# Patient Record
Sex: Male | Born: 1980 | Race: Black or African American | Hispanic: No | Marital: Single | Smoking: Never smoker
Health system: Southern US, Community
[De-identification: ages and names within clinical notes are randomized; demographics above are authoritative.]

## PROBLEM LIST (undated history)

## (undated) DIAGNOSIS — F819 Developmental disorder of scholastic skills, unspecified: Secondary | ICD-10-CM

## (undated) DIAGNOSIS — G510 Bell's palsy: Principal | ICD-10-CM

## (undated) HISTORY — PX: OTHER SURGICAL HISTORY: SHX169

## (undated) HISTORY — DX: Developmental disorder of scholastic skills, unspecified: F81.9

## (undated) HISTORY — DX: Bell's palsy: G51.0

---

## 2003-06-28 ENCOUNTER — Emergency Department (HOSPITAL_COMMUNITY): Admission: EM | Admit: 2003-06-28 | Discharge: 2003-06-28 | Payer: Self-pay | Admitting: Emergency Medicine

## 2004-05-08 ENCOUNTER — Emergency Department (HOSPITAL_COMMUNITY): Admission: EM | Admit: 2004-05-08 | Discharge: 2004-05-08 | Payer: Self-pay | Admitting: Family Medicine

## 2004-05-11 ENCOUNTER — Emergency Department (HOSPITAL_COMMUNITY): Admission: EM | Admit: 2004-05-11 | Discharge: 2004-05-11 | Payer: Self-pay | Admitting: Family Medicine

## 2004-05-12 ENCOUNTER — Emergency Department (HOSPITAL_COMMUNITY): Admission: EM | Admit: 2004-05-12 | Discharge: 2004-05-12 | Payer: Self-pay | Admitting: Emergency Medicine

## 2006-02-21 ENCOUNTER — Emergency Department (HOSPITAL_COMMUNITY): Admission: EM | Admit: 2006-02-21 | Discharge: 2006-02-21 | Payer: Self-pay | Admitting: Emergency Medicine

## 2009-08-01 ENCOUNTER — Emergency Department (HOSPITAL_COMMUNITY): Admission: EM | Admit: 2009-08-01 | Discharge: 2009-08-01 | Payer: Self-pay | Admitting: Emergency Medicine

## 2010-04-02 ENCOUNTER — Inpatient Hospital Stay (INDEPENDENT_AMBULATORY_CARE_PROVIDER_SITE_OTHER)
Admission: RE | Admit: 2010-04-02 | Discharge: 2010-04-02 | Disposition: A | Discharge status: Home / Self Care | Payer: Self-pay | Source: Ambulatory Visit | Attending: Emergency Medicine | Admitting: Emergency Medicine

## 2010-04-02 ENCOUNTER — Ambulatory Visit (INDEPENDENT_AMBULATORY_CARE_PROVIDER_SITE_OTHER): Payer: Medicaid Other

## 2010-04-02 DIAGNOSIS — S93609A Unspecified sprain of unspecified foot, initial encounter: Secondary | ICD-10-CM

## 2011-05-03 ENCOUNTER — Other Ambulatory Visit: Payer: Self-pay | Admitting: Internal Medicine

## 2011-12-20 ENCOUNTER — Emergency Department (INDEPENDENT_AMBULATORY_CARE_PROVIDER_SITE_OTHER): Payer: Medicare Other

## 2011-12-20 ENCOUNTER — Encounter (HOSPITAL_COMMUNITY): Payer: Self-pay

## 2011-12-20 ENCOUNTER — Emergency Department (INDEPENDENT_AMBULATORY_CARE_PROVIDER_SITE_OTHER)
Admission: EM | Admit: 2011-12-20 | Discharge: 2011-12-20 | Disposition: A | Discharge status: Home / Self Care | Payer: Medicare Other | Source: Home / Self Care | Attending: Family Medicine | Admitting: Family Medicine

## 2011-12-20 DIAGNOSIS — J069 Acute upper respiratory infection, unspecified: Secondary | ICD-10-CM

## 2011-12-20 MED ORDER — ACETAMINOPHEN 325 MG PO TABS
ORAL_TABLET | ORAL | Status: AC
  Filled 2011-12-20: qty 3

## 2011-12-20 MED ORDER — LORATADINE-PSEUDOEPHEDRINE ER 10-240 MG PO TB24: 1.0000 | ORAL_TABLET | Freq: Every day | ORAL | Status: DC

## 2011-12-20 MED ORDER — AZITHROMYCIN 250 MG PO TABS: ORAL_TABLET | ORAL | Status: DC

## 2011-12-20 MED ORDER — ACETAMINOPHEN 500 MG PO TABS
1000.0000 mg | ORAL_TABLET | Freq: Once | ORAL | Status: AC
  Administered 2011-12-20: 1000 mg via ORAL

## 2011-12-20 NOTE — ED Provider Notes (Signed)
Medical screening examination/treatment/procedure(s) were performed by non-physician practitioner and as supervising physician I was immediately available for consultation/collaboration.   MORENO-COLL,Demere Dotzler; MD   Eular Panek Moreno-Coll, MD 12/20/11 1442 

## 2011-12-20 NOTE — ED Notes (Signed)
C/o general body aches, fever since yesterday; here with mother , who is also being seen for similar syx

## 2011-12-20 NOTE — ED Provider Notes (Signed)
History     CSN: [id number]  Arrival date & time 12/20/11  1010   First MD Initiated Contact with Patient 12/20/11 1031      Chief Complaint  Patient presents with  . Fever    (Consider location/radiation/quality/duration/timing/severity/associated sxs/prior treatment) Patient is a 31 y.o. male presenting with fever. The history is provided by the patient.  Fever Primary symptoms of the febrile illness include fever, cough, nausea and myalgias. The current episode started yesterday. This is a new problem.  The maximum temperature recorded prior to his arrival was 101 to 101.9 F.    History reviewed. No pertinent past medical history.  History reviewed. No pertinent past surgical history.  History reviewed. No pertinent family history.  History  Substance Use Topics  . Smoking status: Not on file  . Smokeless tobacco: Not on file  . Alcohol Use: Not on file      Review of Systems  Constitutional: Positive for fever, chills and appetite change.  HENT: Positive for congestion, sore throat, rhinorrhea and sneezing.   Respiratory: Positive for cough.   Cardiovascular: Positive for chest pain.  Gastrointestinal: Positive for nausea.  Musculoskeletal: Positive for myalgias.  Neurological: Positive for dizziness.  All other systems reviewed and are negative.    Allergies  Review of patient's allergies indicates no known allergies.  Home Medications   Current Outpatient Rx  Name  Route  Sig  Dispense  Refill  . AZITHROMYCIN 250 MG PO TABS      Azithromycin 500mg  on day 1, then 250mg  on days 2-4   6 tablet   0   . LORATADINE-PSEUDOEPHEDRINE ER 10-240 MG PO TB24   Oral   Take 1 tablet by mouth daily.   30 tablet   0     BP 115/74  Pulse 93  Temp 98.3 F (36.8 C) (Oral)  Resp 19  SpO2 93%  Physical Exam  Nursing note and vitals reviewed. Constitutional: He is oriented to person, place, and time. Vital signs are normal. He appears well-developed and  well-nourished. He is active and cooperative.  HENT:  Head: Normocephalic.  Right Ear: Hearing, external ear and ear canal normal. Tympanic membrane is bulging.  Left Ear: Hearing, external ear and ear canal normal. Tympanic membrane is bulging.  Nose: Nose normal. Right sinus exhibits no maxillary sinus tenderness and no frontal sinus tenderness. Left sinus exhibits no maxillary sinus tenderness and no frontal sinus tenderness.  Mouth/Throat: Uvula is midline and mucous membranes are normal. Posterior oropharyngeal erythema present. No oropharyngeal exudate or posterior oropharyngeal edema.  Eyes: Conjunctivae normal are normal. Pupils are equal, round, and reactive to light. No scleral icterus.  Neck: Trachea normal. Neck supple.  Cardiovascular: Normal rate and regular rhythm.   Pulmonary/Chest: Effort normal and breath sounds normal.  Neurological: He is alert and oriented to person, place, and time. No cranial nerve deficit or sensory deficit.  Skin: Skin is warm and dry.  Psychiatric: He has a normal mood and affect. His speech is normal and behavior is normal. Judgment and thought content normal. Cognition and memory are normal.    ED Course  Procedures (including critical care time)   Labs Reviewed  POCT RAPID STREP A ([facility] CARE ONLY)   Dg Chest 2 View  12/20/2011  *RADIOLOGY REPORT*  Clinical Data: Fever, cough.  CHEST - 2 VIEW  Comparison: None.  Findings: Mild elevation of the left hemidiaphragm.  No confluent airspace opacities.  Heart is normal size.  No effusions  or acute bony abnormality.  IMPRESSION: No acute cardiopulmonary disease.   Original Report Authenticated By: Charlett Nose, M.D.      1. URI (upper respiratory infection)       MDM  Increase fluid intake, rest.  Begin Azithromycin.  Begin expectorant/decongestant, topical decongestant, saline nasal spray and cough suppressant at bedtime. Antihistamines of your choice (Claritin or Zyrtec).  Tylenol or Motrin  for fever/discomfort.  Followup with PCP if not improving 7 to 10 days.         Johnsie Kindred, NP 12/20/11 1308

## 2012-12-09 DIAGNOSIS — G510 Bell's palsy: Secondary | ICD-10-CM

## 2012-12-09 HISTORY — DX: Bell's palsy: G51.0

## 2012-12-24 ENCOUNTER — Encounter (HOSPITAL_COMMUNITY): Payer: Self-pay | Admitting: Emergency Medicine

## 2012-12-24 DIAGNOSIS — G510 Bell's palsy: Secondary | ICD-10-CM | POA: Insufficient documentation

## 2012-12-24 DIAGNOSIS — H5789 Other specified disorders of eye and adnexa: Secondary | ICD-10-CM | POA: Insufficient documentation

## 2012-12-24 NOTE — ED Notes (Addendum)
Pt states around 1900 last night he started having some facial swelling in his right side and some twitching in his right eye.  Pt denies any other symptoms including weakness, SOB, CP or nausea.  Mother states family history of bells pausy

## 2012-12-25 ENCOUNTER — Emergency Department (HOSPITAL_COMMUNITY)
Admission: EM | Admit: 2012-12-25 | Discharge: 2012-12-25 | Disposition: A | Discharge status: Home / Self Care | Payer: Medicare Other | Attending: Emergency Medicine | Admitting: Emergency Medicine

## 2012-12-25 DIAGNOSIS — G510 Bell's palsy: Secondary | ICD-10-CM

## 2012-12-25 MED ORDER — PREDNISONE 20 MG PO TABS: ORAL_TABLET | ORAL | Status: DC

## 2012-12-25 MED ORDER — PREDNISONE 20 MG PO TABS
60.0000 mg | ORAL_TABLET | Freq: Once | ORAL | Status: AC
  Administered 2012-12-25: 60 mg via ORAL
  Filled 2012-12-25: qty 3

## 2012-12-25 MED ORDER — ARTIFICIAL TEARS OP OINT: TOPICAL_OINTMENT | Freq: Three times a day (TID) | OPHTHALMIC | Status: DC

## 2012-12-25 MED ORDER — ARTIFICIAL TEARS OP OINT
TOPICAL_OINTMENT | Freq: Once | OPHTHALMIC | Status: AC
  Administered 2012-12-25: 1 via OPHTHALMIC
  Filled 2012-12-25: qty 3.5

## 2012-12-25 NOTE — ED Provider Notes (Signed)
CSN: [id number]     Arrival date & time 12/24/12  2054 History   First MD Initiated Contact with Patient 12/25/12 0016     Chief Complaint  Patient presents with  . Facial Swelling   (Consider location/radiation/quality/duration/timing/severity/associated sxs/prior Treatment) HPI Patient presents with greater than 24 hours of right facial droop and right eye irritation. He denies taste or hearing changes. He's had no recent fevers or chills. He has no focal extremity weakness or numbness. He has no vision changes. History reviewed. No pertinent past medical history. History reviewed. No pertinent past surgical history. Family History  Problem Relation Age of Onset  . Hyperlipidemia Mother   . Hypertension Mother   . Stroke Mother   . Hypertension Other   . Diabetes Other   . Stroke Other    History  Substance Use Topics  . Smoking status: Never Smoker   . Smokeless tobacco: Not on file  . Alcohol Use: No    Review of Systems  Constitutional: Negative for fever and chills.  HENT: Negative for ear discharge, ear pain, facial swelling, hearing loss, trouble swallowing and voice change.   Eyes: Positive for redness. Negative for visual disturbance.  Respiratory: Negative for chest tightness and shortness of breath.   Cardiovascular: Negative for chest pain.  Gastrointestinal: Negative for nausea, vomiting and abdominal pain.  Musculoskeletal: Negative for back pain, neck pain and neck stiffness.  Skin: Negative for rash and wound.  Neurological: Positive for facial asymmetry and weakness (right upper and lower facial weakness). Negative for dizziness, light-headedness, numbness and headaches.  All other systems reviewed and are negative.    Allergies  Review of patient's allergies indicates no known allergies.  Home Medications   Current Outpatient Rx  Name  Route  Sig  Dispense  Refill  . Ketotifen Fumarate (ALLERGY EYE DROPS OP)   Ophthalmic   Apply 1 drop to eye  daily.          BP 122/70  Pulse 59  Temp(Src) 97.6 F (36.4 C) (Oral)  Resp 18  Wt 216 lb 2 oz (98.034 kg)  SpO2 99% Physical Exam  Nursing note and vitals reviewed. Constitutional: He is oriented to person, place, and time. He appears well-developed and well-nourished. No distress.  HENT:  Head: Normocephalic and atraumatic.  Right Ear: External ear normal.  Left Ear: External ear normal.  Mouth/Throat: Oropharynx is clear and moist.  No facial swelling  Eyes: EOM are normal. Pupils are equal, round, and reactive to light.  Mild conjunctival injection in the right eye.  Neck: Normal range of motion. Neck supple.  No meningismus  Cardiovascular: Normal rate and regular rhythm.   Pulmonary/Chest: Effort normal and breath sounds normal. No respiratory distress. He has no wheezes. He has no rales. He exhibits no tenderness.  Abdominal: Soft. Bowel sounds are normal. He exhibits no distension and no mass. There is no tenderness. There is no rebound and no guarding.  Musculoskeletal: Normal range of motion. He exhibits no edema and no tenderness.  Neurological: He is alert and oriented to person, place, and time.  Patient is alert and oriented x3 with clear, goal oriented speech. Patient has 5/5 motor in all extremities. Sensation is intact to light touch. Bilateral finger-to-nose is normal with no signs of dysmetria. Patient has a normal gait and walks without assistance. Patient has both upper and lower facial weakness on the right. No facial numbness.   Skin: Skin is warm and dry. No rash noted. No erythema.  Psychiatric: He has a normal mood and affect. His behavior is normal.    ED Course  Procedures (including critical care time) Labs Review Labs Reviewed - No data to display Imaging Review No results found.  EKG Interpretation   None       MDM   1. Bell's palsy    His history and exam are consistent with Bell's palsy. We'll provide eye protection and started  on. Patient is to followup with neurology. Return precautions have been given.    Loren Racer, MD 12/25/12 [phone]

## 2012-12-25 NOTE — ED Notes (Signed)
Dr Yelverton at bedside.  

## 2012-12-30 ENCOUNTER — Ambulatory Visit (INDEPENDENT_AMBULATORY_CARE_PROVIDER_SITE_OTHER): Payer: Medicare Other | Admitting: Neurology

## 2012-12-30 ENCOUNTER — Encounter: Payer: Self-pay | Admitting: Neurology

## 2012-12-30 VITALS — BP 134/70 | HR 60 | Temp 97.6°F | Resp 12 | Ht 72.0 in | Wt 217.7 lb

## 2012-12-30 DIAGNOSIS — G510 Bell's palsy: Secondary | ICD-10-CM | POA: Insufficient documentation

## 2012-12-30 NOTE — Patient Instructions (Addendum)
You have Bell's Palsy. The management plan is outlined below:  1. Continue to take prednisone as prescribed 2. Apply eye protection such as eye patch to eye when sleeping  3. Apply ointment to eye prior to sleeping  4. Apply normal saline eye drops as needed throughout the day 5. If you develop signs of eye irritation (redness, pain, vision changes), see an eye care specialist 6. Return to clinic in 3-weeks, or sooner

## 2012-12-30 NOTE — Progress Notes (Signed)
[facility] Clinic Note - Initial Visit   Date: 12/30/2012    Stephen Reese MRN: [id number] DOB: 1980-02-10   Dear Dr Ranae Palms:  Thank you for your kind referral of Stephen Reese for consultation of right facial weakness. Although his history is well known to you, please allow [place] to reiterate it for the purpose of our medical record. The patient was accompanied to the clinic by his mother.   History of Present Illness: Stephen Reese is a 32 y.o. year-old left-handed African American male with known learning disability presenting for evaluation of right facial weakness.  On 12/15, his sister noticed that he developed right facial droop which worsened over the next day.  He was seen in the Emergency Department on 12/17 for right facial weakness and diagnosed with Bell's palsy.  Prednisone was started and he was instructed to follow-up with neurology.    He feels that his symptoms are getting a little bit better.  Denies any alleviating or exacerbating factors.  He is using eye ointment three times a day.  Denies any dry eyes, but says that it stings sometimes.  He reports having minimal drooling.  He feels that when he eats, food gets stuck in the corner of his mouth.  Denies any vision, swallowing, or taste problems.  No fevers, chills, or ear or facial pain.  No recent tick bite or rash.  Out-side paper records, electronic medical record, and images have been reviewed where available and summarized as:  CT head wo contrast 06/28/2003:  No evidence of acute intracranial pathology.    Past Medical History  Diagnosis Date  . Bell's palsy 12/2012    Right side  . Learning disability     Past Surgical History  Procedure Laterality Date  . None       Medications:  Current Outpatient Prescriptions on File Prior to Visit  Medication Sig Dispense Refill  . artificial tears (LACRILUBE) OINT ophthalmic ointment Place into the right eye 3 (three) times  daily.  1 Tube  0  . predniSONE (DELTASONE) 20 MG tablet Take 3 tabs PO x 6 days, then 2 tabs PO x 2 days, then 1 tab PO x 2 days  24 tablet  0   No current facility-administered medications on file prior to visit.    Allergies: No Known Allergies  Family History: Family History  Problem Relation Age of Onset  . Hyperlipidemia Mother   . Hypertension Mother   . Transient ischemic attack Mother   . Hypertension Other   . Diabetes Other   . Stroke Other   . Bell's palsy Sister     Social History: History   Social History  . Marital Status: Single    Spouse Name: N/A    Number of Children: N/A  . Years of Education: N/A   Occupational History  . Not on file.   Social History Main Topics  . Smoking status: Never Smoker   . Smokeless tobacco: Not on file  . Alcohol Use: No  . Drug Use: No  . Sexual Activity: Not on file   Other Topics Concern  . Not on file   Social History Narrative   Lives with mother.   He is single.    He has a learning disability.  Highest level of education is high school.    Review of Systems:  CONSTITUTIONAL: No fevers, chills, night sweats, or weight loss.   EYES: No visual changes or eye pain ENT:  No hearing changes.  No history of nose bleeds.   RESPIRATORY: No cough, wheezing and shortness of breath.   CARDIOVASCULAR: Negative for chest pain, and palpitations.   GI: Negative for abdominal discomfort, blood in stools or black stools.  No recent change in bowel habits.   GU:  No history of incontinence.   MUSCLOSKELETAL: No history of joint pain or swelling.  No myalgias.   SKIN: Negative for lesions, rash, and itching.   HEMATOLOGY/ONCOLOGY: Negative for prolonged bleeding, bruising easily, and swollen nodes.    ENDOCRINE: Negative for cold or heat intolerance, polydipsia or goiter.   PSYCH:  No depression or anxiety symptoms.   NEURO: As Above.   Vital Signs:  BP 134/70  Pulse 60  Temp(Src) 97.6 F (36.4 C)  Resp 12  Ht 6'  (1.829 m)  Wt 217 lb 11.2 oz (98.748 kg)  BMI 29.52 kg/m2    Neurological Exam: MENTAL STATUS including orientation to time, place, person, recent and remote memory, attention span and concentration, language, and fund of knowledge is fairly normal.  Speech is slow and fragmented slightly (baseline).  CRANIAL NERVES: II:  No visual field defects.  Unremarkable fundi.   III-IV-VI: Pupils equal round and reactive to light.  Normal conjugate, extra-ocular eye movements in all directions of gaze.  No nystagmus.  Mild right ptosis with markedly reduced blink on the right.  V: Normal facial sensation throughout. VII:  Marked facial asymmetric with right facial weakness.  Frontalis 3/5 on the right (able to make a few creases), oribicularis oculi 3/5 with Bell's phenomenon, oribularis oris 3/5, buccinator 2/5, platysma 2/5 on the right.  Facial muscles on the left are intact and 5/5. VIII:  Normal hearing and vestibular function.   IX-X:  Normal palatal movement.   XI:  Normal shoulder shrug and head rotation.   XII:  Normal tongue strength and range of motion, no deviation or fasciculation.  MOTOR:  No atrophy, fasciculations or abnormal movements.  No pronator drift.  Tone is normal.    Right Upper Extremity:    Left Upper Extremity:    Deltoid  5/5   Deltoid  5/5   Biceps  5/5   Biceps  5/5   Triceps  5/5   Triceps  5/5   Wrist extensors  5/5   Wrist extensors  5/5   Wrist flexors  5/5   Wrist flexors  5/5   Finger extensors  5/5   Finger extensors  5/5   Finger flexors  5/5   Finger flexors  5/5   Dorsal interossei  5/5   Dorsal interossei  5/5   Abductor pollicis  5/5   Abductor pollicis  5/5   Tone (Ashworth scale)  0  Tone (Ashworth scale)  0   Right Lower Extremity:    Left Lower Extremity:    Hip flexors  5/5   Hip flexors  5/5   Hip extensors  5/5   Hip extensors  5/5   Knee flexors  5/5   Knee flexors  5/5   Knee extensors  5/5   Knee extensors  5/5   Dorsiflexors  5/5    Dorsiflexors  5/5   Plantarflexors  5/5   Plantarflexors  5/5   Toe extensors  5/5   Toe extensors  5/5   Toe flexors  5/5   Toe flexors  5/5   Tone (Ashworth scale)  0  Tone (Ashworth scale)  0   MSRs:  Right  Left brachioradialis 2+  brachioradialis 2+  biceps 2+  biceps 2+  triceps 2+  triceps 2+  patellar 2+  patellar 2+  ankle jerk 2+  ankle jerk 2+  Hoffman no  Hoffman no  plantar response down  plantar response down   SENSORY:  Normal and symmetric perception of light touch, pinprick, vibration, and proprioception.  Romberg's sign absent.   COORDINATION/GAIT: Normal finger-to- nose-finger and heel-to-shin.  Intact rapid alternating movements bilaterally.  Able to rise from a chair without using arms.  Gait narrow based and stable. Tandem and stressed gait intact.    IMPRESSION: Stephen Reese is a 32 year-old gentleman with learning disability and cognitive delay presenting for evaluation of right facial paresis. 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 There are no other cranial nerves deficits. Based on his history and exam findings, he has severe right Bell's palsy. He was started on prednisone in the Emergency Department where he presented within 72-hr of symptom onset.  He has noted very mild improvement over the past week, but on exam weakness is still quite prominent.  I had extensive discussion regarding pathophysiology, natural course, and management options including strict eye care to prevent corneal abrasion.   PLAN/RECOMMENDATIONS:  1. Continue to complete the course of prednisone as prescribed 2. Apply eye protection such as eye patch to eye when sleeping to prevent corneal abrasion  3. Apply ointment to eye prior to sleeping  4. Apply normal saline eye drops as needed throughout the day. 5. Warning signs and symptoms for eye irritation discussed 6. Return to  clinic in 3-weeks   The duration of this appointment visit was 45 minutes of face-to-face time with the patient.  Greater than 50% of this time was spent in counseling, explanation of diagnosis, planning of further management, and coordination of care.   Thank you for allowing me to participate in patient's care.  If I can answer any additional questions, I would be pleased to do so.    Sincerely,    Taralynn Quiett K. Allena Katz, DO

## 2013-01-21 ENCOUNTER — Ambulatory Visit (INDEPENDENT_AMBULATORY_CARE_PROVIDER_SITE_OTHER): Payer: Medicare Other | Admitting: Neurology

## 2013-01-21 ENCOUNTER — Encounter: Payer: Self-pay | Admitting: Neurology

## 2013-01-21 VITALS — BP 118/62 | HR 80 | Resp 18 | Ht 72.0 in | Wt 219.1 lb

## 2013-01-21 DIAGNOSIS — G510 Bell's palsy: Secondary | ICD-10-CM | POA: Diagnosis not present

## 2013-01-21 NOTE — Patient Instructions (Signed)
I'm happy to see that your facial weakness is nearly completely resolved.  If you have any new neurological symptoms, you are most welcome to schedule an appointment to see me.

## 2013-01-21 NOTE — Progress Notes (Signed)
Follow-up Visit   Date: 01/21/2013    KELLAR WESTBERG MRN: [id number] DOB: 1980/06/20   Interim History: Stephen Reese is a 33 y.o. left-handed African American male with history of learning disability returning to the clinic for follow-up of right Bell's palsy.  The patient was accompanied to the clinic by mother who also provides collateral information.  He was last seen in the clinic on 12/30/2012.   He is doing great since the last visit.  His facial weakness has essentially resolved. Denies any eye pain, vision changes, drooling.  He has no new complaints.  Denies abnormal tearing with yawning or chewing.  History of present illness: On 12/15, his sister noticed that he developed right facial droop which worsened over the next day. He was seen in the Emergency Department on 12/17 for right facial weakness and diagnosed with Bell's palsy. Prednisone was started and he was instructed to follow-up with neurology.    Medications:  Current Outpatient Prescriptions on File Prior to Visit  Medication Sig Dispense Refill  . artificial tears (LACRILUBE) OINT ophthalmic ointment Place into the right eye 3 (three) times daily.  1 Tube  0   No current facility-administered medications on file prior to visit.    Allergies: No Known Allergies   Review of Systems:  CONSTITUTIONAL: No fevers, chills, night sweats, or weight loss.   EYES: No visual changes or eye pain ENT: No hearing changes.  No history of nose bleeds.   RESPIRATORY: No cough, wheezing and shortness of breath.   CARDIOVASCULAR: Negative for chest pain, and palpitations.   GI: Negative for abdominal discomfort, blood in stools or black stools.  No recent change in bowel habits.   GU:  No history of incontinence.   MUSCLOSKELETAL: No history of joint pain or swelling.  No myalgias.   SKIN: Negative for lesions, rash, and itching.   ENDOCRINE: Negative for cold or heat intolerance, polydipsia or goiter.   PSYCH:  No  depression or anxiety symptoms.   NEURO: As Above.   Vital Signs:  BP 118/62  Pulse 80  Resp 18  Ht 6' (1.829 m)  Wt 219 lb 2 oz (99.394 kg)  BMI 29.71 kg/m2  Neurological Exam:  CRANIAL NERVES: No visual field defects. Pupils equal round and reactive to light.  Normal conjugate, extra-ocular eye movements in all directions of gaze. Subtle right ptosis as baseline, blink is now symmetric with no lag on the right. Normal facial sensation.  Marked improvement with facial symmetric, now only mild right flattening when the patient smiles, there is normal motor strength of the frontalis (symmetric creases of the forehead), orbicularis oculi, buccinator, and platsyma.  There is subtle weakness of the orbularis oris on the right. Palate elevates symmetrically.  Tongue is midline.    MOTOR:  Motor strength is 5/5 in all extremities  MSRs:  Reflexes are 2+/4 throughout  SENSORY:  Intact to light touch.  COORDINATION/GAIT:   Gait narrow based and stable.   Data: CT head wo contrast 06/28/2003: No evidence of acute intracranial pathology.    IMPRESSION/PLAN: Right Bell's palsy, with marked improvement on today's examination.  There is very subtle weakness of the orbicularis oculi and mild asymmetric with smile, otherwise facial muscles are intact.    Given the improvement of symptoms, no need for continued use of eye drops.  I did counsel that in a small subset of patients with Bell's there can be aberrent innervation which can cause tearing while chewing/yawning  as well as abnormal facial movement and recurrence of Bell's palsy can also occur.  Return to clinic as needed for worsening or new neurological symptoms.    The duration of this appointment visit was 15 minutes of face-to-face time with the patient.  Greater than 50% of this time was spent in counseling, explanation of diagnosis, planning of further management, and coordination of care.   Thank you for allowing me to  participate in patient's care.  If I can answer any additional questions, I would be pleased to do so.    Sincerely,    Tally Mckinnon K. Allena KatzPatel, DO

## 2013-07-25 IMAGING — CR DG CHEST 2V
2 series · 2 of 2 positions shown · non-contrast
Comparison: None.

CLINICAL DATA: Fever, cough.

CHEST - 2 VIEW

[view not recorded (1 of 2)]
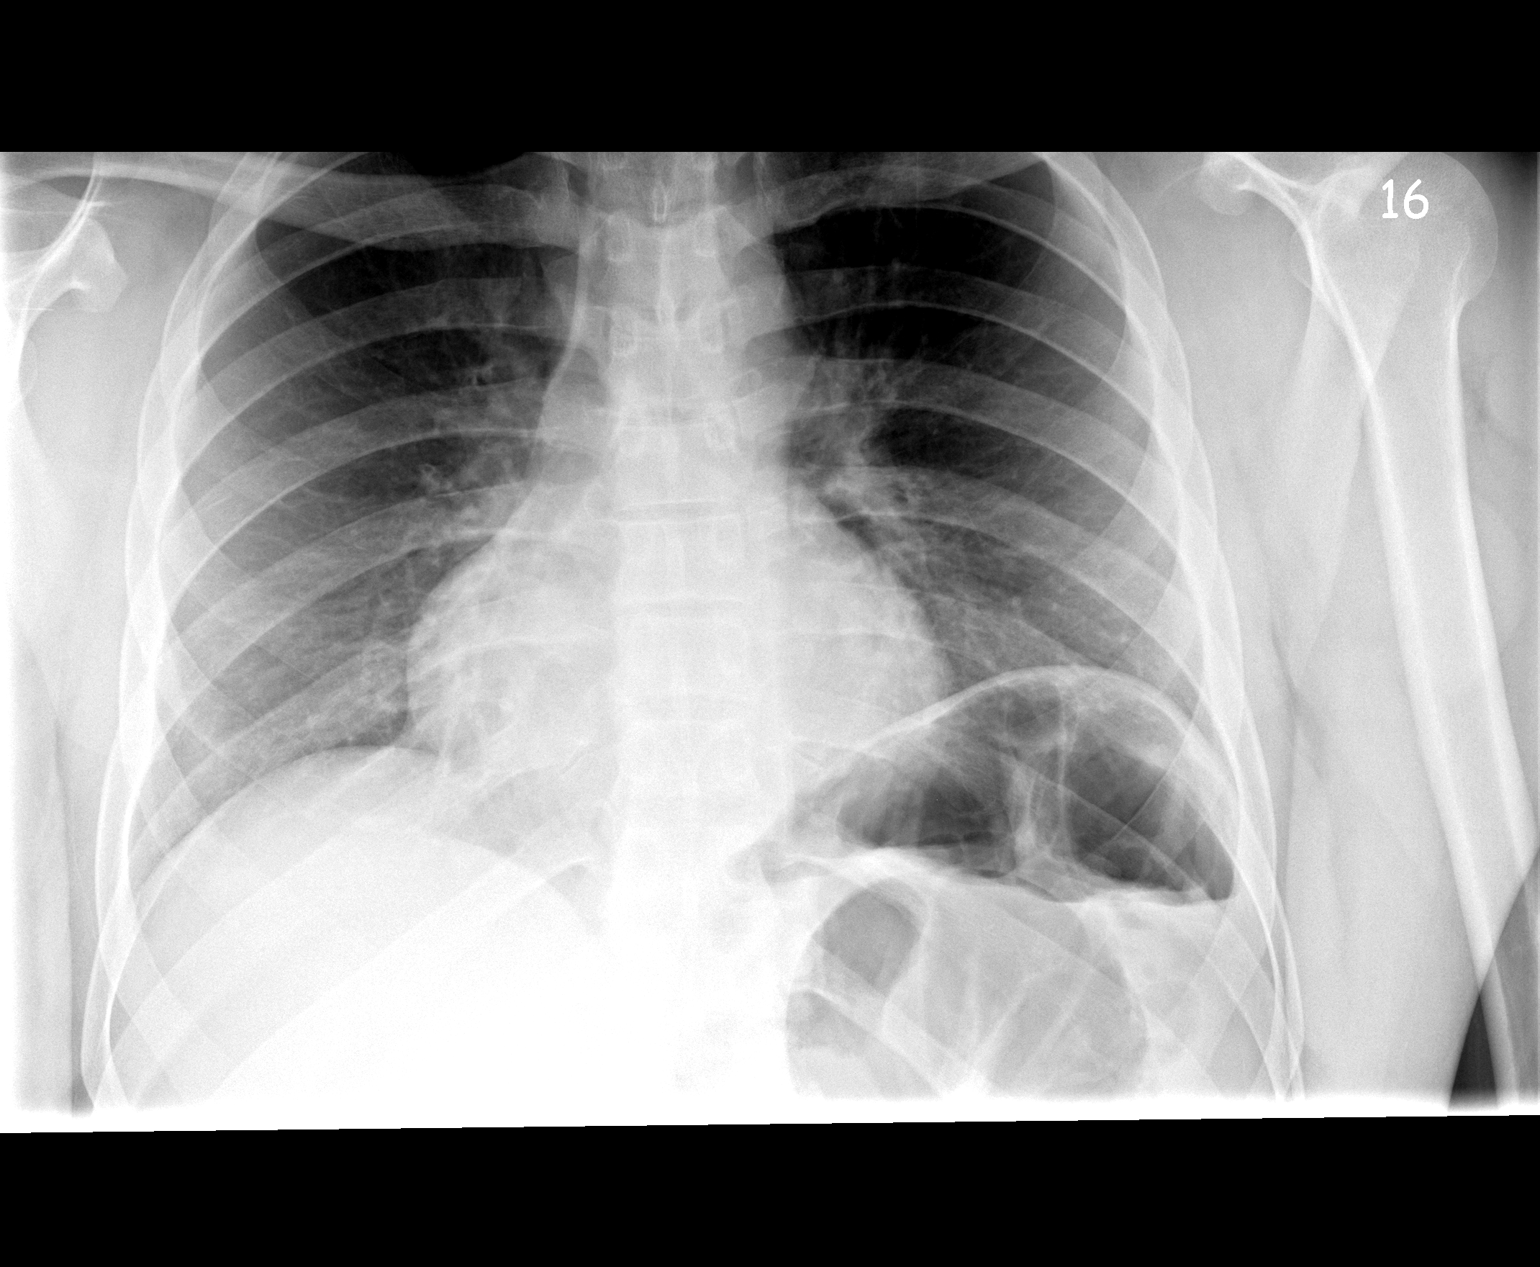

[view not recorded (2 of 2)]
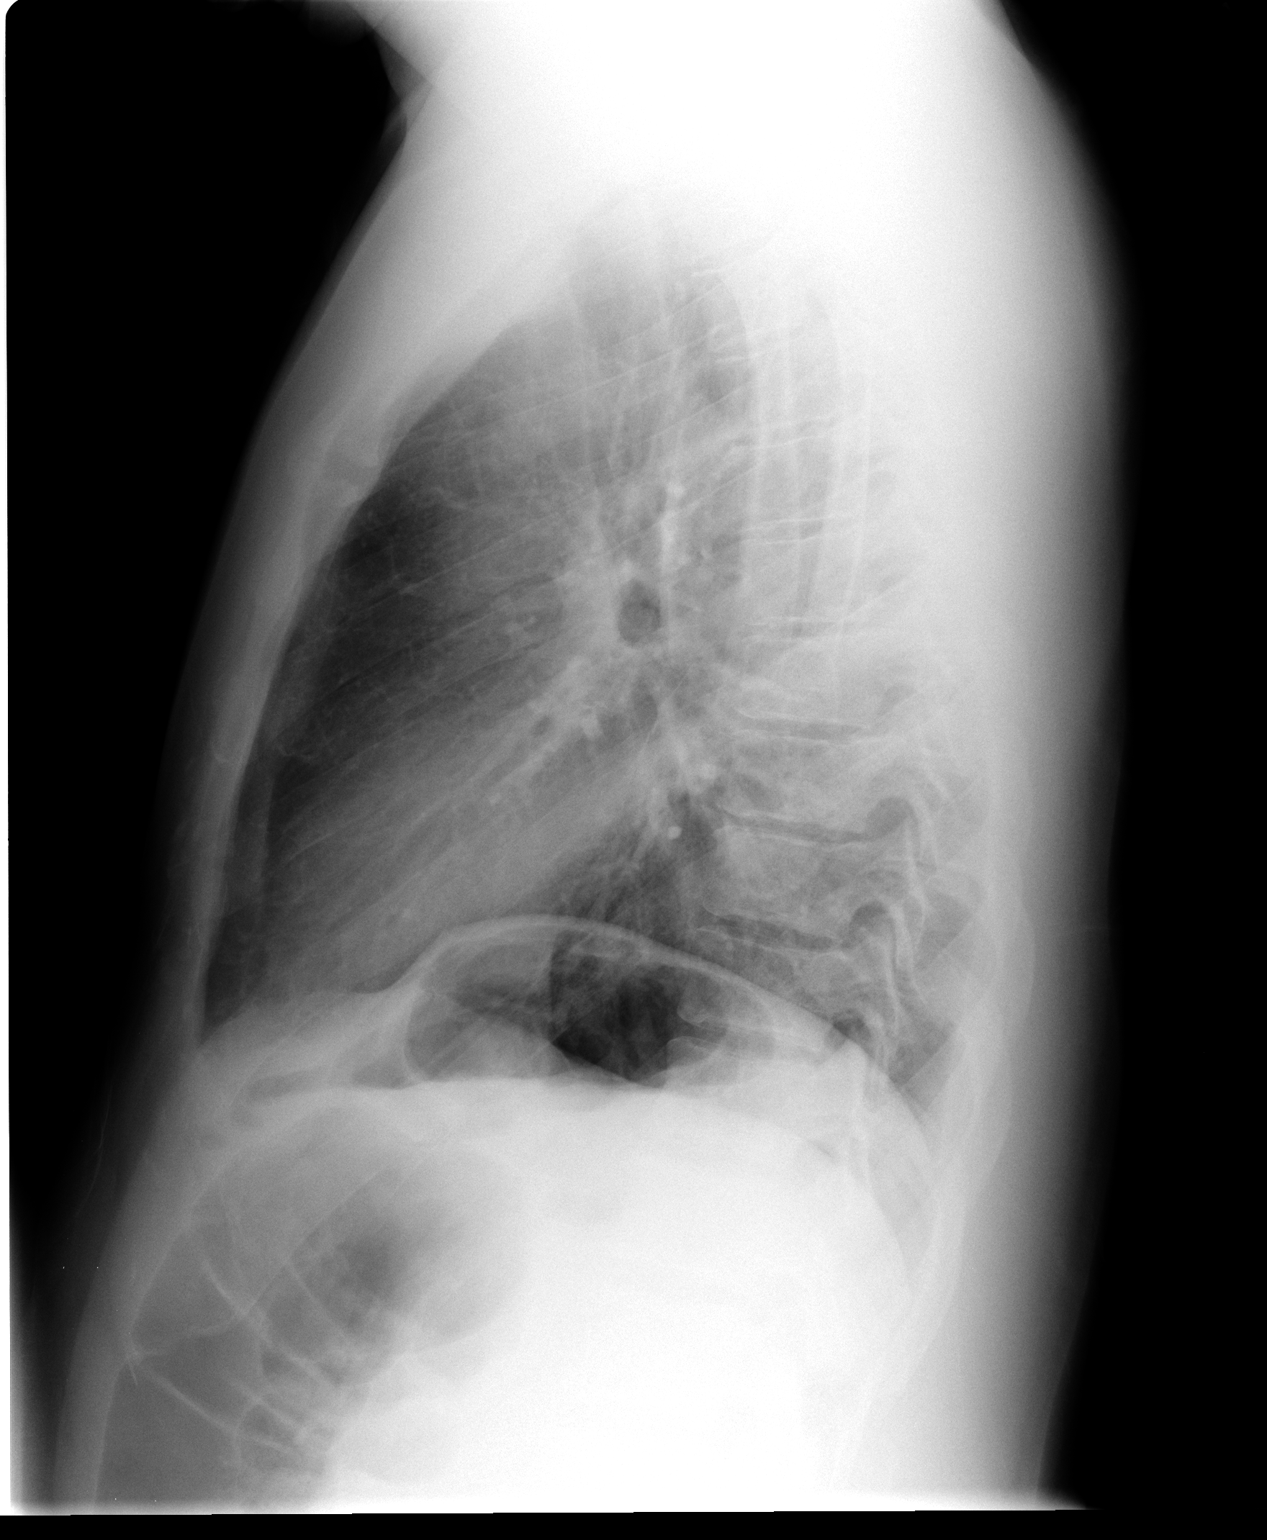

[2 of 2 positions shown; findings below may reference images not displayed]

FINDINGS: Mild elevation of the left hemidiaphragm.  No confluent
airspace opacities.  Heart is normal size.  No effusions or acute
bony abnormality.
IMPRESSION: No acute cardiopulmonary disease.

## 2014-07-25 ENCOUNTER — Emergency Department (INDEPENDENT_AMBULATORY_CARE_PROVIDER_SITE_OTHER)
Admission: EM | Admit: 2014-07-25 | Discharge: 2014-07-25 | Disposition: A | Discharge status: Home / Self Care | Payer: Medicare Other | Source: Home / Self Care | Attending: Emergency Medicine | Admitting: Emergency Medicine

## 2014-07-25 ENCOUNTER — Encounter (HOSPITAL_COMMUNITY): Payer: Self-pay | Admitting: Emergency Medicine

## 2014-07-25 DIAGNOSIS — M7581 Other shoulder lesions, right shoulder: Secondary | ICD-10-CM | POA: Diagnosis not present

## 2014-07-25 MED ORDER — IBUPROFEN 600 MG PO TABS: 600.0000 mg | ORAL_TABLET | Freq: Three times a day (TID) | ORAL | Status: AC

## 2014-07-25 NOTE — ED Notes (Signed)
C/o intermittent left arm/shoulder pain onset x1 week Denies inj/trauma Alert, no signs of acute distress.

## 2014-07-25 NOTE — Discharge Instructions (Signed)
You have irritation of the tendons in your shoulder. Take ibuprofen 600 mg 3 times a day for the next week. Put ice on your shoulder for 20 minutes 3 times a day for the next week. You should be feeling better in about a week. If you are still having pain in the shoulder in 1 week, please follow-up with sports medicine.

## 2014-07-25 NOTE — ED Provider Notes (Signed)
CSN: [id number]     Arrival date & time 07/25/14  1647 History   First MD Initiated Contact with Patient 07/25/14 1717     Chief Complaint  Patient presents with  . Shoulder Pain   (Consider location/radiation/quality/duration/timing/severity/associated sxs/prior Treatment) HPI  He is a 34 year old man here with his sister for evaluation of right shoulder pain. This pain started about 10 days ago. He describes intermittent sharp pain in his anterior and lateral shoulder. He states it comes and goes. Sometimes with movement. He states it has stayed about the same since it started. He has tried some creams without improvement. He has not tried any medications. He denies any injury or trauma. No change in activity or heavy lifting. He does sometimes sleep on his right side.  Past Medical History  Diagnosis Date  . Bell's palsy 12/2012    Right side  . Learning disability    Past Surgical History  Procedure Laterality Date  . None     Family History  Problem Relation Age of Onset  . Hyperlipidemia Mother   . Hypertension Mother   . Transient ischemic attack Mother   . Hypertension Other   . Diabetes Other   . Stroke Other   . Bell's palsy Sister    History  Substance Use Topics  . Smoking status: Never Smoker   . Smokeless tobacco: Not on file  . Alcohol Use: No    Review of Systems As in history of present illness Allergies  Review of patient's allergies indicates no known allergies.  Home Medications   Prior to Admission medications   Medication Sig Start Date End Date Taking? Authorizing Provider  ibuprofen (ADVIL,MOTRIN) 600 MG tablet Take 1 tablet (600 mg total) by mouth 3 (three) times daily. For 1 week 07/25/14   Charm RingsErin J Honig, MD   BP 123/71 mmHg  Pulse 57  Temp(Src) 98 F (36.7 C) (Oral)  Resp 18  SpO2 100% Physical Exam  Constitutional: He is oriented to person, place, and time. He appears well-developed and well-nourished. No distress.  Cardiovascular:  Normal rate.   Pulmonary/Chest: Effort normal.  Musculoskeletal:  Right shoulder: No erythema or edema. No bony tenderness. He is tender over the bicipital groove. Full active range of motion. 5 out of 5 strength without pain in supraspinatus and biceps. Negative impingement tests.  Neurological: He is alert and oriented to person, place, and time.    ED Course  Procedures (including critical care time) Labs Review Labs Reviewed - No data to display  Imaging Review No results found.   MDM   1. Right rotator cuff tendinitis    Treat with ibuprofen and ice for the next week. If no improvement in 1 week, follow-up with sports medicine.    Charm RingsErin J Honig, MD 07/25/14 [phone]

## 2016-07-11 DIAGNOSIS — Z0000 Encounter for general adult medical examination without abnormal findings: Secondary | ICD-10-CM | POA: Diagnosis not present

## 2016-07-11 DIAGNOSIS — F819 Developmental disorder of scholastic skills, unspecified: Secondary | ICD-10-CM | POA: Diagnosis not present

## 2016-07-11 DIAGNOSIS — E559 Vitamin D deficiency, unspecified: Secondary | ICD-10-CM | POA: Diagnosis not present

## 2016-07-11 DIAGNOSIS — Z13220 Encounter for screening for lipoid disorders: Secondary | ICD-10-CM | POA: Diagnosis not present

## 2016-07-11 DIAGNOSIS — Z1329 Encounter for screening for other suspected endocrine disorder: Secondary | ICD-10-CM | POA: Diagnosis not present

## 2016-07-11 DIAGNOSIS — E669 Obesity, unspecified: Secondary | ICD-10-CM | POA: Diagnosis not present

## 2016-10-11 DIAGNOSIS — E669 Obesity, unspecified: Secondary | ICD-10-CM | POA: Diagnosis not present

## 2016-10-11 DIAGNOSIS — E559 Vitamin D deficiency, unspecified: Secondary | ICD-10-CM | POA: Diagnosis not present

## 2016-10-11 DIAGNOSIS — Z23 Encounter for immunization: Secondary | ICD-10-CM | POA: Diagnosis not present

## 2016-10-11 DIAGNOSIS — L989 Disorder of the skin and subcutaneous tissue, unspecified: Secondary | ICD-10-CM | POA: Diagnosis not present

## 2017-03-12 DIAGNOSIS — L730 Acne keloid: Secondary | ICD-10-CM | POA: Diagnosis not present

## 2017-07-18 DIAGNOSIS — L730 Acne keloid: Secondary | ICD-10-CM | POA: Diagnosis not present

## 2017-08-15 DIAGNOSIS — Z0000 Encounter for general adult medical examination without abnormal findings: Secondary | ICD-10-CM | POA: Diagnosis not present

## 2017-08-15 DIAGNOSIS — Z1329 Encounter for screening for other suspected endocrine disorder: Secondary | ICD-10-CM | POA: Diagnosis not present

## 2017-08-15 DIAGNOSIS — E669 Obesity, unspecified: Secondary | ICD-10-CM | POA: Diagnosis not present

## 2017-08-15 DIAGNOSIS — M545 Low back pain: Secondary | ICD-10-CM | POA: Diagnosis not present

## 2017-08-15 DIAGNOSIS — E559 Vitamin D deficiency, unspecified: Secondary | ICD-10-CM | POA: Diagnosis not present

## 2017-08-15 DIAGNOSIS — F819 Developmental disorder of scholastic skills, unspecified: Secondary | ICD-10-CM | POA: Diagnosis not present

## 2017-08-15 DIAGNOSIS — Z130 Encounter for screening for diseases of the blood and blood-forming organs and certain disorders involving the immune mechanism: Secondary | ICD-10-CM | POA: Diagnosis not present

## 2017-08-15 DIAGNOSIS — E785 Hyperlipidemia, unspecified: Secondary | ICD-10-CM | POA: Diagnosis not present

## 2018-12-10 ENCOUNTER — Other Ambulatory Visit: Payer: Self-pay

## 2018-12-10 DIAGNOSIS — Z20822 Contact with and (suspected) exposure to covid-19: Secondary | ICD-10-CM

## 2018-12-12 LAB — NOVEL CORONAVIRUS, NAA: SARS-CoV-2, NAA: DETECTED — AB

## 2024-02-11 ENCOUNTER — Encounter: Payer: Self-pay | Admitting: Gastroenterology
